# Patient Record
Sex: Male | Born: 2000 | Race: White | Hispanic: No | Marital: Single | Smoking: Never smoker
Health system: Southern US, Community
[De-identification: ages and names within clinical notes are randomized; demographics above are authoritative.]

## PROBLEM LIST (undated history)

## (undated) DIAGNOSIS — J45909 Unspecified asthma, uncomplicated: Secondary | ICD-10-CM

---
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
## 2018-05-02 DIAGNOSIS — R41840 Attention and concentration deficit: Secondary | ICD-10-CM | POA: Diagnosis not present

## 2018-05-02 DIAGNOSIS — Z23 Encounter for immunization: Secondary | ICD-10-CM | POA: Diagnosis not present

## 2018-08-22 DIAGNOSIS — R062 Wheezing: Secondary | ICD-10-CM | POA: Diagnosis not present

## 2018-08-22 DIAGNOSIS — B9689 Other specified bacterial agents as the cause of diseases classified elsewhere: Secondary | ICD-10-CM | POA: Diagnosis not present

## 2018-08-22 DIAGNOSIS — J019 Acute sinusitis, unspecified: Secondary | ICD-10-CM | POA: Diagnosis not present

## 2018-09-12 DIAGNOSIS — R05 Cough: Secondary | ICD-10-CM | POA: Diagnosis not present

## 2018-09-12 DIAGNOSIS — J3089 Other allergic rhinitis: Secondary | ICD-10-CM | POA: Diagnosis not present

## 2018-09-13 ENCOUNTER — Ambulatory Visit: Payer: BLUE CROSS/BLUE SHIELD | Admitting: Allergy and Immunology

## 2018-09-13 ENCOUNTER — Other Ambulatory Visit: Payer: Self-pay

## 2018-09-13 ENCOUNTER — Encounter: Payer: Self-pay | Admitting: Allergy and Immunology

## 2018-09-13 VITALS — BP 124/72 | HR 70 | Temp 98.2°F | Resp 18 | Ht 71.0 in | Wt 174.5 lb

## 2018-09-13 DIAGNOSIS — T7800XD Anaphylactic reaction due to unspecified food, subsequent encounter: Secondary | ICD-10-CM

## 2018-09-13 DIAGNOSIS — T7800XA Anaphylactic reaction due to unspecified food, initial encounter: Secondary | ICD-10-CM | POA: Insufficient documentation

## 2018-09-13 MED ORDER — EPINEPHRINE 0.3 MG/0.3ML IJ SOAJ: 0.3000 mg | INTRAMUSCULAR | 1 refills | Status: AC | PRN

## 2018-09-13 NOTE — Assessment & Plan Note (Signed)
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  Careful avoidance of tree nuts as discussed.  A prescription has been provided for epinephrine auto-injector 2 pack along with instructions for proper administration.  A food allergy action plan has been provided and discussed.  Medic Alert identification is recommended.

## 2018-09-13 NOTE — Patient Instructions (Addendum)
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  Careful avoidance of tree nuts as discussed.  A prescription has been provided for epinephrine auto-injector 2 pack along with instructions for proper administration.  A food allergy action plan has been provided and discussed.  Medic Alert identification is recommended.   Return in about 1 year (around 09/13/2019), or if symptoms worsen or fail to improve.

## 2018-09-13 NOTE — Progress Notes (Signed)
New Patient Note  RE: Larry Murphy MRN: [id number] DOB: 12/14/2000 Date of Office Visit: 09/13/2018  Referring provider: Rafael BihariKearns, Larry C, MD Primary care provider: Rafael BihariKearns, Larry C, MD  Chief Complaint: Allergy Testing   History of present illness: Larry Murphy is a 18 y.o. male seen today in consultation requested by Larry PippinStephen Kearns, MD.  He is hoping to enlist in the [employer] and is here today to rule out food allergy.  When he was 84 or 18 years old, he consumed ice cream with pecan and experienced a sensation of a "scratchy throat."  He did not experience concomitant urticaria, angioedema, cardiopulmonary symptoms, or other GI symptoms.  He did not require treatment in the emergency department.  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  However, he states that he is able to consume peanut and peanut butter on a regular basis without symptoms.  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  Careful avoidance of tree nuts as discussed.  A prescription has been provided for epinephrine auto-injector 2 pack along with instructions for proper administration.  A food allergy action plan has been provided and discussed.  Medic Alert identification is recommended.   Meds ordered this encounter  Medications  . EPINEPHrine (AUVI-Q) 0.3 mg/0.3 mL IJ SOAJ injection    Sig: Inject 0.3 mLs (0.3 mg total) into the muscle as needed for anaphylaxis.    Dispense:  1 Device    Refill:  1    [phone] (H)    Diagnostics: Food allergen skin testing: Positive to pecan, walnut, and almond.    Physical examination: Blood pressure 124/72, pulse 70, temperature 98.2 F (36.8 Murphy), resp. rate 18, height 5\' 11"  (1.803 m), weight 174 lb 8 oz (79.2 kg), SpO2 99 %.  General: Alert,  interactive, in no acute distress. Neck: Supple without lymphadenopathy. Lungs: Clear to auscultation without wheezing, rhonchi or rales. CV: Normal S1, S2 without murmurs. Abdomen: Nondistended, nontender. Skin: Warm and dry, without lesions or rashes. Extremities:  No clubbing, cyanosis or edema. Neuro:   Grossly intact.  Review of systems:  Review of systems negative except as noted in HPI / PMHx or noted below: Review of Systems  Constitutional: Negative.   HENT: Negative.   Eyes: Negative.   Respiratory: Negative.   Cardiovascular: Negative.   Gastrointestinal: Negative.   Genitourinary: Negative.   Musculoskeletal: Negative.   Skin: Negative.   Neurological: Negative.   Endo/Heme/Allergies: Negative.   Psychiatric/Behavioral: Negative.     Past medical history:  Past Medical History:  Diagnosis Date  . Asthma     Past surgical history:  History reviewed. No pertinent surgical history.  Family history: Family History  Problem Relation Age of Onset  . Allergic rhinitis Father     Social history: Social History   Socioeconomic History  . Marital status: Single    Spouse name: Not on file  . Number of children: Not on file  . Years of education: Not on file  . Highest education level: Not on file  Occupational History  . Not on file  Social Needs  . Financial resource strain: Not on file  . Food insecurity:    Worry: Not on file    Inability: Not on file  . Transportation needs:    Medical:  Not on file    Non-medical: Not on file  Tobacco Use  . Smoking status: Never Smoker  . Smokeless tobacco: Never Used  Substance and Sexual Activity  . Alcohol use: No  . Drug use: No  . Sexual activity: Not on file  Lifestyle  . Physical activity:    Days per week: Not on file    Minutes per session: Not on file  . Stress: Not on file  Relationships  . Social connections:    Talks on phone: Not on file    Gets together: Not on file    Attends religious  service: Not on file    Active member of club or organization: Not on file    Attends meetings of clubs or organizations: Not on file    Relationship status: Not on file  . Intimate partner violence:    Fear of current or ex partner: Not on file    Emotionally abused: Not on file    Physically abused: Not on file    Forced sexual activity: Not on file  Other Topics Concern  . Not on file  Social History Narrative  . Not on file   Environmental History: The patient lives in a 20 year old house with hardwood floors throughout and central air/heat.  There is no known mold/water damage in the home.  There is a dog in the home which does not have access to his bedroom.  He is a non-smoker.  Allergies as of 09/13/2018   No Known Allergies     Medication List       Accurate as of Sep 13, 2018 10:08 AM. If you have any questions, ask your nurse or doctor.        STOP taking these medications   diazepam 5 MG tablet Commonly known as:  VALIUM Stopped by:  Larry Hampshire, MD   ibuprofen 600 MG tablet Commonly known as:  ADVIL Stopped by:  Larry Hampshire, MD   ondansetron 4 MG tablet Commonly known as:  ZOFRAN Stopped by:  Larry Hampshire, MD     TAKE these medications   cetirizine 10 MG tablet Commonly known as:  ZYRTEC Take 10 mg by mouth daily as needed for allergies.   EPINEPHrine 0.3 mg/0.3 mL Soaj injection Commonly known as:  Auvi-Q Inject 0.3 mLs (0.3 mg total) into the muscle as needed for anaphylaxis. Started by:  Larry Hampshire, MD       Known medication allergies: No Known Allergies  I appreciate the opportunity to take part in [name]'s care. Please do not hesitate to contact me with questions.  Sincerely,   R. Jorene Guest, MD

## 2019-01-02 DIAGNOSIS — R5381 Other malaise: Secondary | ICD-10-CM | POA: Diagnosis not present

## 2019-01-02 DIAGNOSIS — M50222 Other cervical disc displacement at C5-C6 level: Secondary | ICD-10-CM | POA: Diagnosis not present

## 2019-01-02 DIAGNOSIS — I2510 Atherosclerotic heart disease of native coronary artery without angina pectoris: Secondary | ICD-10-CM | POA: Diagnosis not present

## 2019-01-02 DIAGNOSIS — W1789XA Other fall from one level to another, initial encounter: Secondary | ICD-10-CM | POA: Diagnosis not present

## 2019-01-02 DIAGNOSIS — G8911 Acute pain due to trauma: Secondary | ICD-10-CM | POA: Diagnosis not present

## 2019-01-02 DIAGNOSIS — S1201XA Stable burst fracture of first cervical vertebra, initial encounter for closed fracture: Secondary | ICD-10-CM | POA: Diagnosis not present

## 2019-01-02 DIAGNOSIS — S0990XA Unspecified injury of head, initial encounter: Secondary | ICD-10-CM | POA: Diagnosis not present

## 2019-01-02 DIAGNOSIS — M40202 Unspecified kyphosis, cervical region: Secondary | ICD-10-CM | POA: Diagnosis not present

## 2019-01-02 DIAGNOSIS — M542 Cervicalgia: Secondary | ICD-10-CM | POA: Diagnosis not present

## 2019-01-02 DIAGNOSIS — Y9319 Activity, other involving water and watercraft: Secondary | ICD-10-CM | POA: Diagnosis not present

## 2019-01-02 DIAGNOSIS — W16612A Jumping or diving into natural body of water striking water surface causing other injury, initial encounter: Secondary | ICD-10-CM | POA: Diagnosis not present

## 2019-01-02 DIAGNOSIS — S12490A Other displaced fracture of fifth cervical vertebra, initial encounter for closed fracture: Secondary | ICD-10-CM | POA: Diagnosis not present

## 2019-01-02 DIAGNOSIS — S12400A Unspecified displaced fracture of fifth cervical vertebra, initial encounter for closed fracture: Secondary | ICD-10-CM | POA: Diagnosis not present

## 2019-01-02 DIAGNOSIS — Y9312 Activity, springboard and platform diving: Secondary | ICD-10-CM | POA: Diagnosis not present

## 2019-01-02 DIAGNOSIS — D6859 Other primary thrombophilia: Secondary | ICD-10-CM | POA: Diagnosis not present

## 2019-01-02 DIAGNOSIS — R0781 Pleurodynia: Secondary | ICD-10-CM | POA: Diagnosis not present

## 2019-01-02 DIAGNOSIS — S199XXA Unspecified injury of neck, initial encounter: Secondary | ICD-10-CM | POA: Diagnosis not present

## 2019-01-17 DIAGNOSIS — N390 Urinary tract infection, site not specified: Secondary | ICD-10-CM | POA: Diagnosis not present

## 2019-01-17 DIAGNOSIS — Z113 Encounter for screening for infections with a predominantly sexual mode of transmission: Secondary | ICD-10-CM | POA: Diagnosis not present

## 2019-02-13 DIAGNOSIS — S12401D Unspecified nondisplaced fracture of fifth cervical vertebra, subsequent encounter for fracture with routine healing: Secondary | ICD-10-CM | POA: Diagnosis not present

## 2019-02-13 DIAGNOSIS — S12400D Unspecified displaced fracture of fifth cervical vertebra, subsequent encounter for fracture with routine healing: Secondary | ICD-10-CM | POA: Diagnosis not present

## 2019-03-16 DIAGNOSIS — S12401S Unspecified nondisplaced fracture of fifth cervical vertebra, sequela: Secondary | ICD-10-CM | POA: Diagnosis not present

## 2019-03-16 DIAGNOSIS — S12400D Unspecified displaced fracture of fifth cervical vertebra, subsequent encounter for fracture with routine healing: Secondary | ICD-10-CM | POA: Diagnosis not present

## 2019-03-22 DIAGNOSIS — M4312 Spondylolisthesis, cervical region: Secondary | ICD-10-CM | POA: Diagnosis not present

## 2019-03-22 DIAGNOSIS — S12490D Other displaced fracture of fifth cervical vertebra, subsequent encounter for fracture with routine healing: Secondary | ICD-10-CM | POA: Diagnosis not present

## 2019-04-03 DIAGNOSIS — S129XXD Fracture of neck, unspecified, subsequent encounter: Secondary | ICD-10-CM | POA: Diagnosis not present

## 2019-04-04 ENCOUNTER — Other Ambulatory Visit: Payer: Self-pay | Admitting: Otolaryngology

## 2019-04-04 ENCOUNTER — Other Ambulatory Visit: Payer: Self-pay | Admitting: Family Medicine

## 2019-04-04 DIAGNOSIS — S12401S Unspecified nondisplaced fracture of fifth cervical vertebra, sequela: Secondary | ICD-10-CM

## 2019-04-10 DIAGNOSIS — S129XXD Fracture of neck, unspecified, subsequent encounter: Secondary | ICD-10-CM | POA: Diagnosis not present

## 2019-04-12 DIAGNOSIS — S129XXD Fracture of neck, unspecified, subsequent encounter: Secondary | ICD-10-CM | POA: Diagnosis not present

## 2019-04-15 ENCOUNTER — Other Ambulatory Visit: Payer: Self-pay

## 2019-04-15 ENCOUNTER — Ambulatory Visit
Admission: RE | Admit: 2019-04-15 | Discharge: 2019-04-15 | Disposition: A | Discharge status: Home / Self Care | Payer: PRIVATE HEALTH INSURANCE | Source: Ambulatory Visit | Attending: Otolaryngology | Admitting: Otolaryngology

## 2019-04-15 DIAGNOSIS — S12401S Unspecified nondisplaced fracture of fifth cervical vertebra, sequela: Secondary | ICD-10-CM

## 2019-04-17 DIAGNOSIS — S129XXD Fracture of neck, unspecified, subsequent encounter: Secondary | ICD-10-CM | POA: Diagnosis not present

## 2019-04-18 DIAGNOSIS — S129XXD Fracture of neck, unspecified, subsequent encounter: Secondary | ICD-10-CM | POA: Diagnosis not present

## 2019-04-25 DIAGNOSIS — S129XXD Fracture of neck, unspecified, subsequent encounter: Secondary | ICD-10-CM | POA: Diagnosis not present

## 2019-04-27 DIAGNOSIS — S129XXD Fracture of neck, unspecified, subsequent encounter: Secondary | ICD-10-CM | POA: Diagnosis not present

## 2021-06-26 IMAGING — MR MR CERVICAL SPINE W/O CM
4 of 5 series · 16 of 48 positions shown · non-contrast
Comparison: None.

CLINICAL DATA: Follow-up C5 fracture.  New neck pain

EXAM:
MRI CERVICAL SPINE WITHOUT CONTRAST
TECHNIQUE: Multiplanar, multisequence MR imaging of the cervical spine was
performed. No intravenous contrast was administered.

[Series 5: T1 · sagittal · 3.0mm · 0.41mm/px · 3 of 13 slices shown]
[im 3/13]
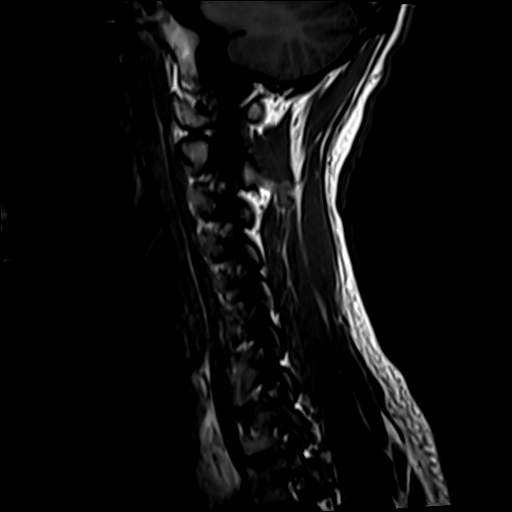
[im 8/13]
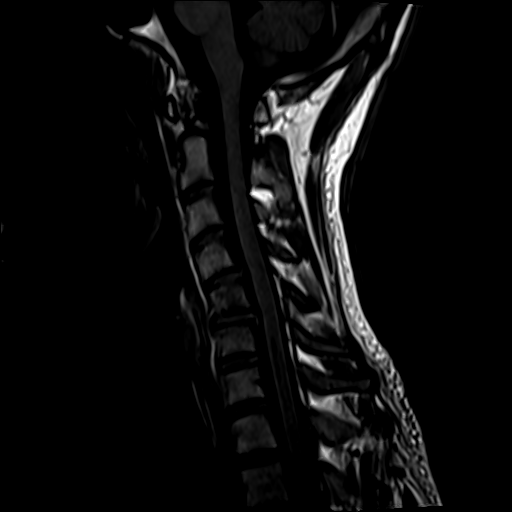
[im 13/13]
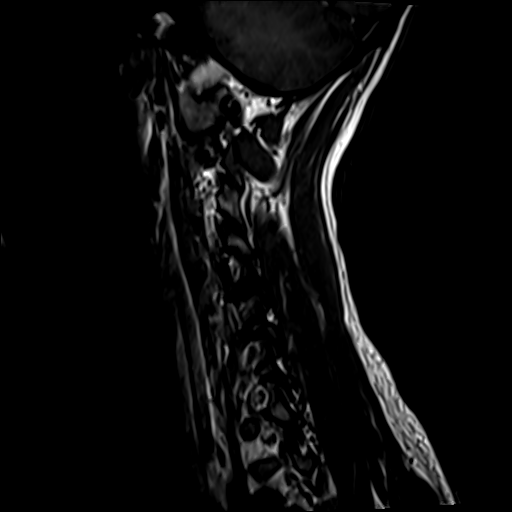

[Series 6: T2 · sagittal · 3.0mm · 0.33mm/px · 6 of 13 slices shown (1 of 2)]
[im 1/13]
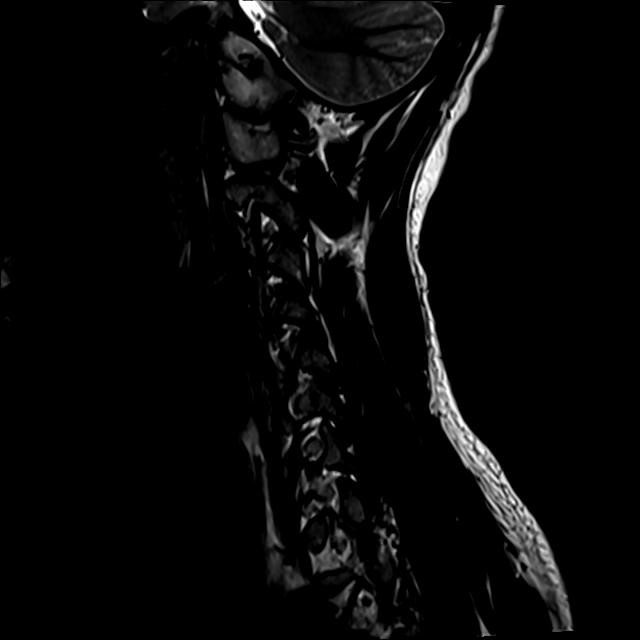
[im 3/13]
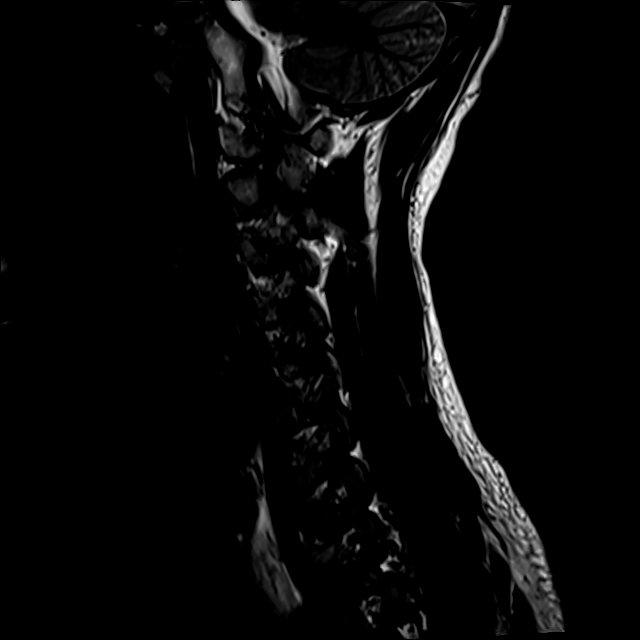
[im 5/13]
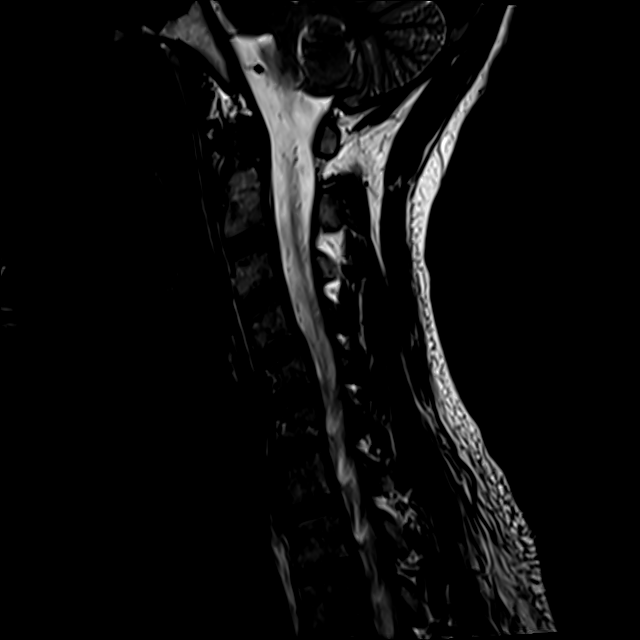
[im 8/13]
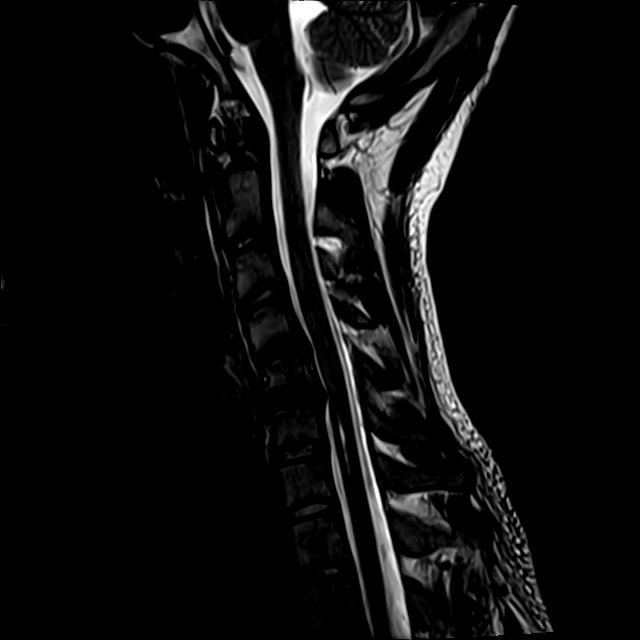
[im 10/13]
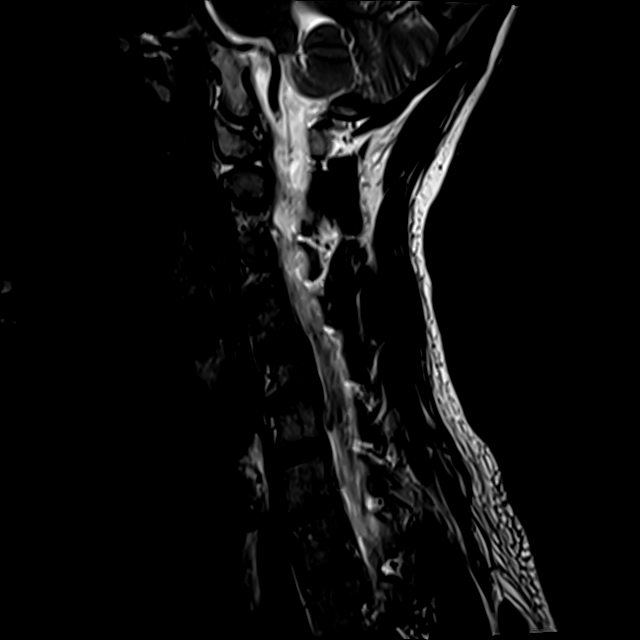
[im 13/13]
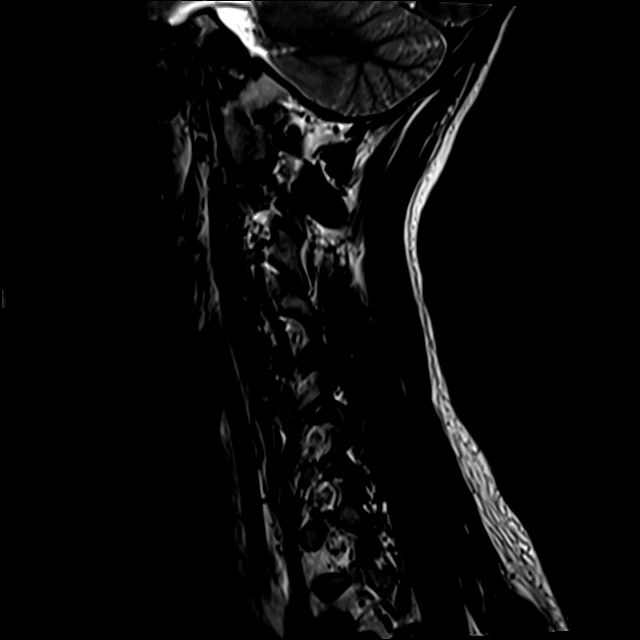

[Series 7: STIR · sagittal · 3.0mm · 0.41mm/px · 3 of 13 slices shown]
[im 3/13]
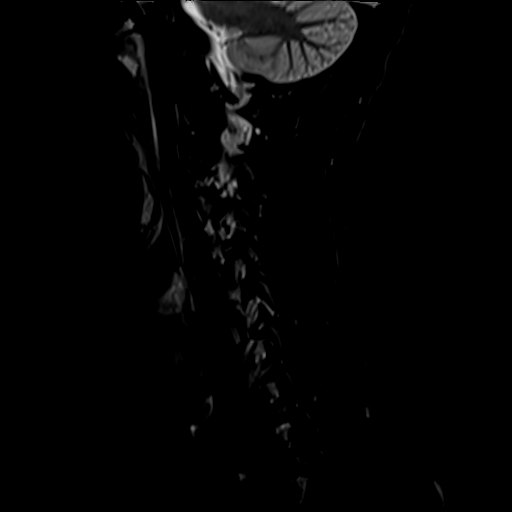
[im 8/13]
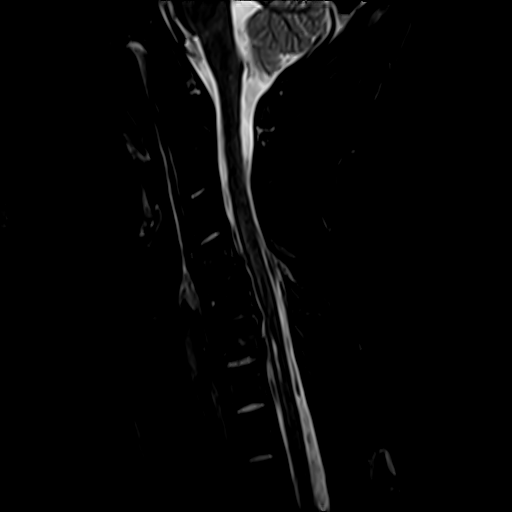
[im 13/13]
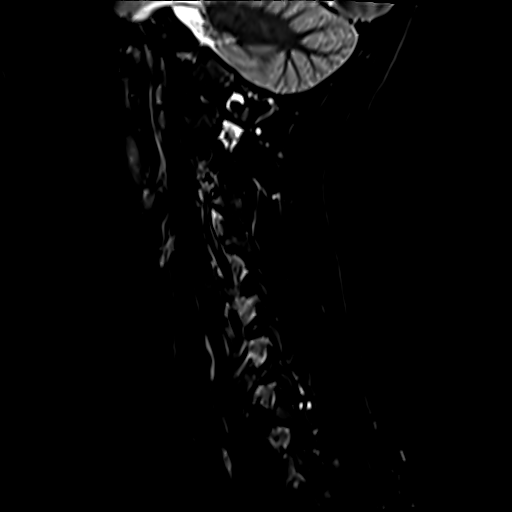

[Series 8: T2 · axial · 3.0mm · 0.31mm/px · z∈[-51,+37]mm · 4 of 30 slices shown (2 of 2)]
[im 1/30]
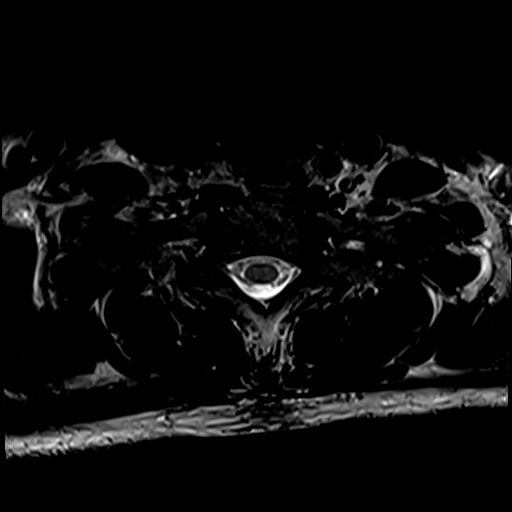
[im 5/30]
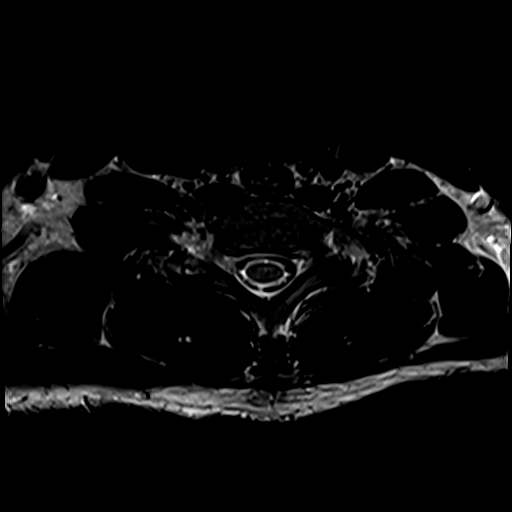
[im 15/30]
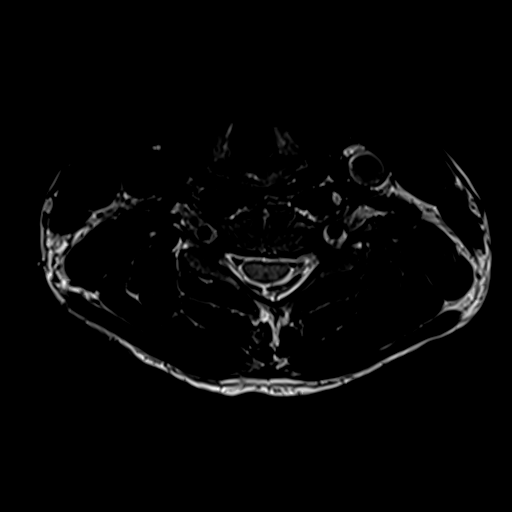
[im 25/30]
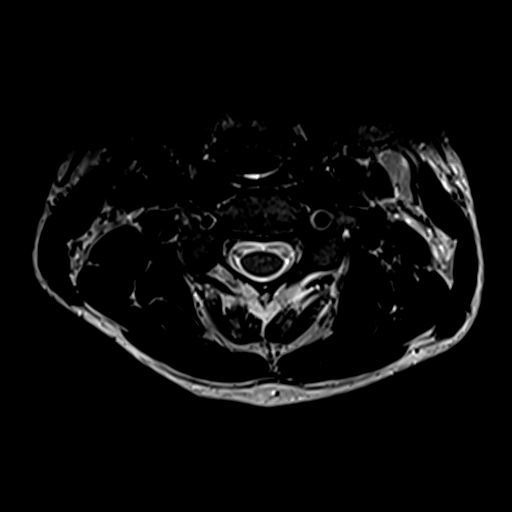

[16 of 48 positions shown; findings below may reference images not displayed]

FINDINGS: Alignment: Mild reversal of cervical lordosis. Approximately 1 mm of
anterolisthesis at C4-5

Vertebrae: C5 anterior wedging correlating with history of fracture.
No residual marrow edema. No fracture or inflammatory process seen
elsewhere.

Especially on sagittal T2 and STIR imaging there are a few slices of
indistinguishable ligamentum flavum at C4-5, where there is
anterolisthesis. As covered, the facets are located at this level.

Cord: Mild hydromyelia at C6 and C7, measuring up to 2 mm in maximal
diameter. This is below a mild ventral cord deformity at C5-6,
further described below. No cord edema or expansion.

Posterior Fossa, vertebral arteries, paraspinal tissues: Negative

Disc levels:

C2-3: Unremarkable.

C3-4: Unremarkable.

C4-5: Relative disc desiccation. Mild anterolisthesis with
ligamentum flavum described above.

C5-6: Mild relative disc desiccation with small central disc
protrusion that contacts and slightly deforms the ventral cord. The
foramina are patent

C6-7: Unremarkable.

C7-T1:Unremarkable.
IMPRESSION: 1. History of C5 fracture which is healed with no marrow edema.
2. C5-6 small disc protrusion with slight ventral cord deformity.
There is mild hydromyelia below this level, question if there was a
larger herniation at time of injury.
3. C4-5 mild anterolisthesis. Indistinct/nonvisualized ligamentum
flavum on sagittal slices compatible with tear at time of injury.
Flexion and extension radiographs may be contributory.
4. Diffusely patent foramina.
